# Patient Record
Sex: Female | Born: 2000 | Race: Black or African American | Hispanic: No | Marital: Single
Health system: Southern US, Community
[De-identification: ages and names within clinical notes are randomized; demographics above are authoritative.]

## PROBLEM LIST (undated history)

## (undated) DIAGNOSIS — O139 Gestational [pregnancy-induced] hypertension without significant proteinuria, unspecified trimester: Secondary | ICD-10-CM

## (undated) DIAGNOSIS — K209 Esophagitis, unspecified: Secondary | ICD-10-CM

## (undated) DIAGNOSIS — F4310 Post-traumatic stress disorder, unspecified: Secondary | ICD-10-CM

## (undated) DIAGNOSIS — F32A Depression, unspecified: Secondary | ICD-10-CM

## (undated) DIAGNOSIS — N946 Dysmenorrhea, unspecified: Secondary | ICD-10-CM

## (undated) HISTORY — DX: Dysmenorrhea, unspecified: N94.6

## (undated) HISTORY — DX: Depression, unspecified: F32.A

## (undated) HISTORY — DX: Post-traumatic stress disorder, unspecified: F43.10

## (undated) HISTORY — DX: Esophagitis, unspecified: K20.9

---
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
## 2019-09-24 ENCOUNTER — Encounter (HOSPITAL_COMMUNITY): Payer: Self-pay

## 2019-09-24 ENCOUNTER — Other Ambulatory Visit: Payer: Self-pay

## 2019-09-24 ENCOUNTER — Inpatient Hospital Stay (HOSPITAL_COMMUNITY)
Admission: EM | Admit: 2019-09-24 | Discharge: 2019-09-24 | Disposition: A | Discharge status: Home / Self Care | Payer: Medicaid Other | Attending: Obstetrics and Gynecology | Admitting: Obstetrics and Gynecology

## 2019-09-24 DIAGNOSIS — A599 Trichomoniasis, unspecified: Secondary | ICD-10-CM | POA: Diagnosis not present

## 2019-09-24 DIAGNOSIS — N898 Other specified noninflammatory disorders of vagina: Secondary | ICD-10-CM | POA: Diagnosis present

## 2019-09-24 DIAGNOSIS — O98813 Other maternal infectious and parasitic diseases complicating pregnancy, third trimester: Secondary | ICD-10-CM

## 2019-09-24 DIAGNOSIS — A5901 Trichomonal vulvovaginitis: Secondary | ICD-10-CM | POA: Insufficient documentation

## 2019-09-24 DIAGNOSIS — Z3A30 30 weeks gestation of pregnancy: Secondary | ICD-10-CM | POA: Insufficient documentation

## 2019-09-24 DIAGNOSIS — Z7722 Contact with and (suspected) exposure to environmental tobacco smoke (acute) (chronic): Secondary | ICD-10-CM | POA: Insufficient documentation

## 2019-09-24 DIAGNOSIS — O98313 Other infections with a predominantly sexual mode of transmission complicating pregnancy, third trimester: Secondary | ICD-10-CM | POA: Diagnosis not present

## 2019-09-24 HISTORY — DX: Gestational (pregnancy-induced) hypertension without significant proteinuria, unspecified trimester: O13.9

## 2019-09-24 LAB — WET PREP, GENITAL
Clue Cells Wet Prep HPF POC: NONE SEEN
Sperm: NONE SEEN
Yeast Wet Prep HPF POC: NONE SEEN

## 2019-09-24 MED ORDER — METRONIDAZOLE 500 MG PO TABS
2000.0000 mg | ORAL_TABLET | Freq: Once | ORAL | Status: AC
  Administered 2019-09-24: 10:00:00 2000 mg via ORAL
  Filled 2019-09-24: qty 4

## 2019-09-24 NOTE — MAU Provider Note (Signed)
Patient Katrina Williams is a 18 y.o. G1P0 At [redacted]w[redacted]d here with complaints of on-going vaginal discharge. She denies rupture of membranes, decreased fetal movements, vaginal bleeding or contractions. She was treated for trich on 11/2, but feels that the discharge has continued.   She was recently diagnosed with pre-e at her doctors office and is getting testing at her doctors office weekly. She is not on medication. She denies HA, nausea, vomiting, swelling, blurry vision, floating spots, RUQ pains.  History     CSN: [id number]  Arrival date and time: 09/24/19 1740   None     Chief Complaint  Patient presents with  . Vaginal Discharge   Vaginal Discharge The patient's primary symptoms include vaginal discharge. The problem occurs constantly (worse at night. it fills up her pad. ). The problem has been unchanged. Pertinent negatives include no urgency or vomiting. The vaginal discharge was copious, milky and yellow. There has been no bleeding. She has not been passing clots. She has not been passing tissue. She is not sexually active.    OB History    Gravida  1   Para      Term      Preterm      AB      Living        SAB      TAB      Ectopic      Multiple      Live Births              Past Medical History:  Diagnosis Date  . Dysmenorrhea 03/21/2016  . Esophagitis 03/21/2016  . Pregnancy induced hypertension     current Pre-E disgnosis    History reviewed. No pertinent surgical history.  Family History  Problem Relation Age of Onset  . Obesity Neg Hx   . Heart disease Neg Hx   . Hypertension Neg Hx   . Hypercholesterolemia Neg Hx   . Diabetes Mellitus II Neg Hx     Social History   Tobacco Use  . Smoking status: Passive Smoke Exposure - Never Smoker  . Smokeless tobacco: Never Used  Substance Use Topics  . Alcohol use: No    Comment: given etoh last night  . Drug use: Yes    Types: Marijuana    Comment: some sort of white substance     Allergies: No Known Allergies  Medications Prior to Admission  Medication Sig Dispense Refill Last Dose  . ondansetron (ZOFRAN ODT) 4 MG disintegrating tablet Take 1 tablet (4 mg total) by mouth every 8 (eight) hours as needed for nausea or vomiting. 8 tablet 0   . Prenatal Vit-Fe Fumarate-FA (PRENATAL VITAMIN) 27-0.8 MG TABS Take 1 tablet by mouth daily. 30 tablet 11     Review of Systems  Constitutional: Negative.   HENT: Negative.   Respiratory: Negative.   Cardiovascular: Negative.   Gastrointestinal: Negative.  Negative for vomiting.  Genitourinary: Positive for vaginal discharge. Negative for urgency.  Musculoskeletal: Negative.   Neurological: Negative.   Hematological: Negative.   Psychiatric/Behavioral: Negative.    Physical Exam   Blood pressure 140/74, pulse 75, temperature 98 F (36.7 C), temperature source Oral, resp. rate 16, SpO2 98 %.  Physical Exam  Constitutional: She is oriented to person, place, and time. She appears well-developed and well-nourished.  HENT:  Head: Normocephalic.  Neck: Normal range of motion.  Respiratory: Effort normal.  GI: Soft.  Genitourinary:    Genitourinary Comments: NEFG; copious yellow discharge in the  vagina. Cervix is visually closed; no odor or clumps. No lesions on vaginal walls. No CMT.    Musculoskeletal: Normal range of motion.  Neurological: She is alert and oriented to person, place, and time.  Skin: Skin is warm and dry.  Psychiatric: She has a normal mood and affect.    MAU Course  Procedures  MDM -reviewed patient's records from ob appts; patient was positive for trich on 11/2 and negative for other STIs or infections.  -NST: 135 bpm, mod var, present acel, neg decels, no contractions.  Wet prep: positive for trich again, will re-treat today.  GC pending.   Assessment and Plan   1. Trichomoniasis    2. Patient stable for discharge; tolerated 2 grams Flagyl in the MAU without complaint.   3. List of  providers given; strict return precautions given for pre-e. Plan to keep appt on 11/18 at her doctors' office.   4. Plan of care reviewed with Dr. Ilda Basset, who agrees safe for discharge.    Mervyn Skeeters Kooistra 09/24/2019, 9:41 AM

## 2019-09-24 NOTE — MAU Note (Signed)
Pt is a G1P0 at 30 weeks c/o milky, yellow vaginal discharge, no itching, no smell, no vaginal bleeding, good fetal movement, and no pain.    She was previously treated for tric on 09/15/2019 and completed the medication and had not had sex since.  Pt is not c/o continued discharge and is not able to visit her MD until 11/18.  Pt recently moved to [facility] but is currently getting care in [facility].

## 2019-09-24 NOTE — Discharge Instructions (Signed)
[facility] for [facility] Prenatal Care Providers          **WHEN you call, tell them you are [place] for [place] [facility]   Phone: [phone]    Trichomonas Test Why am I having this test? The trichomonas test is done to diagnose trichomoniasis, a sexually-transmitted infection (STI) caused by an organism called Trichomonas. You may have this test as a part of a routine screening for STIs or if you have symptoms of trichomoniasis. What kind of sample is taken? To perform the test, your health care provider will either ask you to provide a urine sample or take a sample of discharge. The sample will be taken from the vagina or cervix in women and from the urethra in men. How are the results reported? Your test results will be reported as either positive or negative. It is up to you to get your test results. Ask your health care provider, or the department that is doing the test, when your results will be ready. What do the results mean? Negative test result A negative result means that you do not have trichomoniasis. Follow your health care provider's directions about any follow-up testing. Positive test result A positive result means that you have an active infection that needs to be treated with antibiotic medicine. All your current sexual partners must also be treated. If they are not, you will likely get reinfected. If your test result is positive, your health care provider will start you on medicine and may advise you:  Not to have sex until your infection has cleared up.  To use a latex condom properly every time you have sex.  To limit the number of sexual partners you have. The more partners you have, the greater your risk of contracting trichomoniasis or another STI.  To tell all sexual partners about your infection so that they can also get tested and treated. This will prevent reinfection. Talk with your health care provider to discuss your results,  treatment options, and if necessary, the need for more tests. Talk with your health care provider if you have any questions about your results. This information is not intended to replace advice given to you by your health care provider. Make sure you discuss any questions you have with your health care provider. Document Released: 12/02/2004 Document Revised: 10/12/2017 Document Reviewed: 12/26/2016 Elsevier Patient Education  2020 [employer].

## 2019-09-25 LAB — GC/CHLAMYDIA PROBE AMP (~~LOC~~) NOT AT ARMC
Chlamydia: NEGATIVE
Comment: NEGATIVE
Comment: NORMAL
Neisseria Gonorrhea: NEGATIVE

## 2020-06-05 ENCOUNTER — Emergency Department (HOSPITAL_COMMUNITY)
Admission: EM | Admit: 2020-06-05 | Discharge: 2020-06-06 | Disposition: A | Discharge status: Home / Self Care | Payer: Medicaid Other | Attending: Emergency Medicine | Admitting: Emergency Medicine

## 2020-06-05 ENCOUNTER — Emergency Department (HOSPITAL_COMMUNITY): Payer: Medicaid Other

## 2020-06-05 ENCOUNTER — Encounter (HOSPITAL_COMMUNITY): Payer: Self-pay | Admitting: Emergency Medicine

## 2020-06-05 ENCOUNTER — Other Ambulatory Visit: Payer: Self-pay

## 2020-06-05 DIAGNOSIS — M542 Cervicalgia: Secondary | ICD-10-CM | POA: Diagnosis not present

## 2020-06-05 DIAGNOSIS — Y929 Unspecified place or not applicable: Secondary | ICD-10-CM | POA: Insufficient documentation

## 2020-06-05 DIAGNOSIS — R1110 Vomiting, unspecified: Secondary | ICD-10-CM | POA: Diagnosis not present

## 2020-06-05 DIAGNOSIS — S0990XA Unspecified injury of head, initial encounter: Secondary | ICD-10-CM | POA: Diagnosis not present

## 2020-06-05 DIAGNOSIS — Y999 Unspecified external cause status: Secondary | ICD-10-CM | POA: Diagnosis not present

## 2020-06-05 DIAGNOSIS — Y939 Activity, unspecified: Secondary | ICD-10-CM | POA: Insufficient documentation

## 2020-06-05 DIAGNOSIS — Z5321 Procedure and treatment not carried out due to patient leaving prior to being seen by health care provider: Secondary | ICD-10-CM | POA: Diagnosis not present

## 2020-06-05 LAB — I-STAT BETA HCG BLOOD, ED (MC, WL, AP ONLY): I-stat hCG, quantitative: >2000 m[IU]/mL — ABNORMAL HIGH (ref <5)
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
## 2020-06-06 NOTE — ED Notes (Signed)
Pt left. 

## 2020-12-28 ENCOUNTER — Inpatient Hospital Stay (HOSPITAL_COMMUNITY)
Admission: EM | Admit: 2020-12-28 | Discharge: 2020-12-28 | Disposition: A | Discharge status: Home / Self Care | Payer: Medicaid Other | Attending: Obstetrics and Gynecology | Admitting: Obstetrics and Gynecology

## 2020-12-28 DIAGNOSIS — Z3401 Encounter for supervision of normal first pregnancy, first trimester: Secondary | ICD-10-CM | POA: Diagnosis not present

## 2020-12-28 DIAGNOSIS — Z3A00 Weeks of gestation of pregnancy not specified: Secondary | ICD-10-CM

## 2020-12-28 DIAGNOSIS — Z3201 Encounter for pregnancy test, result positive: Secondary | ICD-10-CM | POA: Insufficient documentation

## 2020-12-28 DIAGNOSIS — Z3490 Encounter for supervision of normal pregnancy, unspecified, unspecified trimester: Secondary | ICD-10-CM

## 2020-12-28 LAB — I-STAT BETA HCG BLOOD, ED (MC, WL, AP ONLY): I-stat hCG, quantitative: >2000 m[IU]/mL — ABNORMAL HIGH (ref <5)

## 2020-12-28 NOTE — MAU Note (Signed)
Patient asked if she can have ultrasound if she signs out and signs back in with pregnancy complaints.  Patient informed I can't tell her she can't sign in but that we are an acute care setting that utilizes our resources to treat patients with medical emergencies.  Then reassured the patient that the provider would be in to discuss options with her as to where she could be seen for her verification.

## 2020-12-28 NOTE — ED Triage Notes (Signed)
Patient here for pregnancy test. Sent by family planning. Patient has had positive home pregnancy tests.

## 2020-12-28 NOTE — ED Notes (Signed)
Pt is pregnant and needs an ultrasound to confirm how many weeks she is for further treatment at a womans choice clinic.  Pt does not know how many weeks she is but states that her last menstrual period was 10/21

## 2020-12-28 NOTE — MAU Note (Signed)
Patient wants U/S to confirm dates for a woman's choice for termination of pregnancy.  Denies any complaints.

## 2020-12-28 NOTE — MAU Provider Note (Signed)
Event Date/Time   First Provider Initiated Contact with Patient 12/28/20 1321      S Ms. Yissel Habermehl is a 20 y.o. G1P0 patient who presents to MAU today with complaint of none. Patient reports she is here for an ultrasound so she can get a termination. Patient reports she called a termination office to schedule and was told that she needs to get an [place] to verify how far along she is. Patient reports LMP 08/2020. Patient upset that she cannot get an [place] through MAU and reports she is considering checking out and checking back in with a new concern that her baby is not growing so that she can get an ultrasound. Patient denies any bleeding or pain.  O BP 140/77    Pulse (!) 102    Temp 98 F (36.7 C)    Resp 17    SpO2 100%    Patient Vitals for the past 24 hrs:  BP Temp Pulse Resp SpO2  12/28/20 1312 140/77 98 F (36.7 C) (!) 102 17 --  12/28/20 1141 (!) 148/83 98.5 F (36.9 C) (!) 101 14 100 %    Physical Exam Vitals and nursing note reviewed.  Constitutional:      Appearance: Normal appearance.  HENT:     Head: Normocephalic and atraumatic.  Pulmonary:     Effort: Pulmonary effort is normal.  Abdominal:     Comments: Fundal height palpated just above umbilicus, but pt states she has a retroverted uterus.  Neurological:     Mental Status: She is alert and oriented to person, place, and time.  Psychiatric:        Mood and Affect: Mood normal.        Behavior: Behavior normal.        Thought Content: Thought content normal.        Judgment: Judgment normal.    A Medical screening exam complete Pregnancy with uncertain dates  P Discharge from MAU in stable condition Outpatient [place] ordered List of options for follow-up given Warning signs for worsening condition that would warrant emergency follow-up discussed Patient may return to MAU as needed   Armari Fussell, Odie Sera, NP 12/28/2020 1:41 PM

## 2020-12-28 NOTE — ED Triage Notes (Signed)
Emergency Medicine Provider OB Triage Evaluation Note  Katrina Williams is a 20 y.o. female, G1P0, at Unknown gestation who presents to the emergency department for pregnancy test.  Patient reports her last menstrual cycle was sometime in October she does not have an exact date.  She took a home pregnancy test this week and it was positive.  She attempted to go to a family-planning center for pregnancy termination but was informed she needed to come to the ER instead for an accurate gestational age prior to the procedure.  Review of  Systems  Positive: None Negative: Fever chills abdominal pain nausea vomiting diarrhea dysuria hematuria vaginal bleeding vaginal discharge or any additional concerns.  Physical Exam  BP (!) 148/83 (BP Location: Left Arm)   Pulse (!) 101   Temp 98.5 F (36.9 C)   Resp 14   SpO2 100%  General: Awake, no distress  HEENT: Atraumatic  Resp: Normal effort  Cardiac: Normal rate Abd: Nondistended, nontender  MSK: Moves all extremities without difficulty Neuro: Speech clear  Medical Decision Making  Pt evaluated for pregnancy concern, i-STAT beta-hCG positive in the ER.  The patient is stable for transfer to MAU. Pt is in agreement with plan for transfer.  12:40 PM Discussed with MAU APP, Sam, who accepts patient in transfer.    Clinical Impression   1. Positive pregnancy test        Bill Salinas, PA-C 12/28/20 1240

## 2020-12-28 NOTE — Discharge Instructions (Signed)
Prenatal Care Providers           [facility] - accepts patients without insurance  Phone: [phone]  [facility] @ [facility]   Phone: [phone]  [facility]       Phone: [phone]            [facility] @ [facility]     Phone: [phone]          [facility] @ [place]   Phone: [phone]  [facility] @ [facility] - accepts patients without insurance  Phone: [phone]  [facility] @ [facility] Phone: [phone]     [facility] - accepts patients without insurance Phone: [phone]  [facility]  Phone: [phone]  [facility] Phone: [phone]  [facility] Phone: [phone]  [facility] Phone: [phone]  [facility] Phone: [phone]  [facility]  Phone: [phone]         

## 2020-12-30 ENCOUNTER — Other Ambulatory Visit: Payer: Self-pay

## 2020-12-30 ENCOUNTER — Other Ambulatory Visit: Payer: Self-pay | Admitting: Women's Health

## 2020-12-30 ENCOUNTER — Telehealth: Payer: Self-pay | Admitting: Medical

## 2020-12-30 ENCOUNTER — Ambulatory Visit (HOSPITAL_BASED_OUTPATIENT_CLINIC_OR_DEPARTMENT_OTHER): Payer: Medicaid Other

## 2020-12-30 ENCOUNTER — Ambulatory Visit
Admission: RE | Admit: 2020-12-30 | Discharge: 2020-12-30 | Disposition: A | Discharge status: Home / Self Care | Payer: Medicaid Other | Source: Ambulatory Visit | Attending: Women's Health | Admitting: Women's Health

## 2020-12-30 DIAGNOSIS — Z3490 Encounter for supervision of normal pregnancy, unspecified, unspecified trimester: Secondary | ICD-10-CM | POA: Diagnosis not present

## 2020-12-30 NOTE — Progress Notes (Signed)
[place] order.  Marylen Ponto, NP  10:21 AM 12/30/2020

## 2020-12-30 NOTE — Telephone Encounter (Signed)
I called Shelly Bombard today at 3:41 PM and confirmed patient's identity using two patient identifiers. [place] results from earlier today were reviewed. Patient voiced understanding and had no further questions.   [place] MFM OB LIMITED  Result Date: 12/30/2020 ----------------------------------------------------------------------  OBSTETRICS REPORT                       (Signed Final 12/30/2020 03:34 pm) ---------------------------------------------------------------------- Patient Info  ID #:       [id number]                          D.O.B.:  03/18/2001 (19 yrs)  Name:       Katrina Williams                   Visit Date: 12/30/2020 01:55 pm ---------------------------------------------------------------------- Performed By  Attending:        Noralee Space MD        Referred By:      Marylen Ponto  Performed By:     Magnus Ivan           [facility] ---------------------------------------------------------------------- Orders  #  Description                           Code        Ordered By  1  [place] MFM OB LIMITED                     76815.01    NICOLE NUGENT ----------------------------------------------------------------------  #  Order #                     Accession #                Episode #  1  [id number]                   [id number]                 [id number] ---------------------------------------------------------------------- Indications  [redacted] weeks gestation of pregnancy                Z3A.18  Encounter for uncertain dates                  Z36.87 ---------------------------------------------------------------------- Fetal Evaluation  Num Of  Fetuses:         1  Fetal Heart Rate(bpm):  147  Cardiac Activity:       Observed  Placenta:               Anterior  Amniotic Fluid  AFI FV:       Subjectively normal ---------------------------------------------------------------------- Biometry  BPD:      42.1  mm     G. Age:  18w 5d         81  %    CI:        78.59   %    70 - 86                                                          FL/HC:      15.8   %    15.8 - 18  HC:      150.2  mm     G. Age:  18w 1d         46  %    HC/AC:      1.21        1.07 - 1.29  AC:       124   mm     G. Age:  18w 0d         49  %    FL/BPD:     56.3   %  FL:       23.7  mm     G. Age:  17w 1d         16  %    FL/AC:      19.1   %    20 - 24  Est. FW:     205  gm      0 lb 7 oz     26  % ---------------------------------------------------------------------- OB History  Gravidity:    2         Term:   1  Living:       1 ---------------------------------------------------------------------- Gestational Age  U/S Today:     18w 0d                                        EDD:   06/02/21  Best:          18w 0d     Det. By:  U/S (12/30/20)           EDD:   06/02/21 ---------------------------------------------------------------------- Cervix Uterus Adnexa  Right Ovary  Size(cm)       2.7  x   1.6    x  1.6       Vol(ml): 3.62  Within normal limits.  Left Ovary  Size(cm)        3   x   2.1    x  2.3       Vol(ml): 7.59  Small corpus luteum noted. ---------------------------------------------------------------------- Impression  G1 P0. Patient is here for pregnancy-dating scan.  A limited ultrasound study was performed. Fetal biometry is  consistent with 18-weeks' gestation. Amniotic fluid is normal.  Detailed fetal anatomical survey was not performed.  This study was remotely read.  I will communicate with  Vonzella Nipple, NP through [facility] ---------------------------------------------------------------------- Recommendations  New Ob appointment and patient to be referred for detailed  fetal anatomy scan in 2 weeks. ----------------------------------------------------------------------                  Noralee Space, MD Electronically  Signed Final Report   12/30/2020 03:34 pm ----------------------------------------------------------------------   Vonzella Nipple, PA-C 12/30/2020 3:41 PM

## 2021-01-05 ENCOUNTER — Other Ambulatory Visit: Payer: Medicaid Other

## 2021-02-06 ENCOUNTER — Emergency Department (HOSPITAL_COMMUNITY)
Admission: EM | Admit: 2021-02-06 | Discharge: 2021-02-06 | Disposition: A | Discharge status: Home / Self Care | Payer: Medicaid Other | Attending: Emergency Medicine | Admitting: Emergency Medicine

## 2021-02-06 ENCOUNTER — Emergency Department (HOSPITAL_COMMUNITY): Payer: Medicaid Other

## 2021-02-06 ENCOUNTER — Encounter (HOSPITAL_COMMUNITY): Payer: Self-pay | Admitting: Emergency Medicine

## 2021-02-06 ENCOUNTER — Other Ambulatory Visit: Payer: Self-pay

## 2021-02-06 DIAGNOSIS — Y92410 Unspecified street and highway as the place of occurrence of the external cause: Secondary | ICD-10-CM | POA: Insufficient documentation

## 2021-02-06 DIAGNOSIS — R519 Headache, unspecified: Secondary | ICD-10-CM | POA: Diagnosis not present

## 2021-02-06 DIAGNOSIS — S8991XA Unspecified injury of right lower leg, initial encounter: Secondary | ICD-10-CM | POA: Diagnosis present

## 2021-02-06 DIAGNOSIS — Z7722 Contact with and (suspected) exposure to environmental tobacco smoke (acute) (chronic): Secondary | ICD-10-CM | POA: Insufficient documentation

## 2021-02-06 DIAGNOSIS — S86911A Strain of unspecified muscle(s) and tendon(s) at lower leg level, right leg, initial encounter: Secondary | ICD-10-CM | POA: Insufficient documentation

## 2021-02-06 DIAGNOSIS — S00531A Contusion of lip, initial encounter: Secondary | ICD-10-CM | POA: Diagnosis not present

## 2021-02-06 MED ORDER — ACETAMINOPHEN 500 MG PO TABS
1000.0000 mg | ORAL_TABLET | Freq: Once | ORAL | Status: AC
  Administered 2021-02-06: 1000 mg via ORAL
  Filled 2021-02-06: qty 2

## 2021-02-06 MED ORDER — IBUPROFEN 200 MG PO TABS
600.0000 mg | ORAL_TABLET | Freq: Once | ORAL | Status: AC
  Administered 2021-02-06: 600 mg via ORAL
  Filled 2021-02-06: qty 3

## 2021-02-06 NOTE — ED Notes (Signed)
Patient transported to CT 
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
## 2021-02-06 NOTE — ED Notes (Addendum)
Patient ambulatory with limp in treatment room. Reports pain with ambulation. MD made aware.
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
## 2021-02-06 NOTE — Discharge Instructions (Addendum)
If you develop headache, neck pain, chest pain, trouble breathing, abdominal pain, vomiting, new or worsening pain in your leg, are unable to walk/bear weight or do not improve over the next 24-48 hours then return to the ER or see your doctor

## 2021-03-21 ENCOUNTER — Other Ambulatory Visit: Payer: Medicaid Other
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
## 2024-05-22 ENCOUNTER — Ambulatory Visit (HOSPITAL_COMMUNITY)
Admission: EM | Admit: 2024-05-22 | Discharge: 2024-05-22 | Disposition: A | Discharge status: Home / Self Care | Attending: Psychiatry | Admitting: Psychiatry

## 2024-05-22 DIAGNOSIS — Z560 Unemployment, unspecified: Secondary | ICD-10-CM | POA: Insufficient documentation

## 2024-05-22 DIAGNOSIS — Z91199 Patient's noncompliance with other medical treatment and regimen due to unspecified reason: Secondary | ICD-10-CM | POA: Insufficient documentation

## 2024-05-22 DIAGNOSIS — Z9151 Personal history of suicidal behavior: Secondary | ICD-10-CM | POA: Insufficient documentation

## 2024-05-22 DIAGNOSIS — F39 Unspecified mood [affective] disorder: Secondary | ICD-10-CM | POA: Insufficient documentation

## 2024-05-22 NOTE — Discharge Instructions (Addendum)
 Discharge recommendations:   Outpatient Follow up:   You are encouraged to follow up with [facility] for outpatient treatment.  You are scheduled for counseling on July 29, 2024 at 1: 00 PM  You are scheduled for psychiatry on July 16, 2024 at 1:00 PM  Walk in/ Open Access Hours: Monday - Friday 8AM - 11AM (Please arrive at 7 AM)  [facility] [address] [place], [place] [phone]  Therapy: We recommend that patient participate in individual therapy to address mental health concerns.  Safety:   The following safety precautions should be taken:   No sharp objects. This includes scissors, razors, scrapers, and putty knives.   Chemicals should be removed and locked up.   Medications should be removed and locked up.   Weapons should be removed and locked up. This includes firearms, knives and instruments that can be used to cause injury.   The patient should abstain from use of illicit substances/drugs and abuse of any medications.  If symptoms worsen or do not continue to improve or if the patient becomes actively suicidal or homicidal then it is recommended that the patient return to the closest hospital emergency department, the [facility], or call 911 for further evaluation and treatment. National Suicide Prevention Lifeline 1-800-SUICIDE or 737-452-1436.  About 988 988 offers 24/7 access to trained crisis counselors who can help people experiencing mental health-related distress. People can call or text 988 or chat 988lifeline.org for themselves or if they are worried about a loved one who may need crisis support.   Mindfulness-Based Stress Reduction Mindfulness-based stress reduction (MBSR) is a program that helps people learn to practice mindfulness. Mindfulness is the practice of consciously paying attention to the present moment. MBSR focuses on developing self-awareness, which lets  you respond to life stress without judgment or negative feelings. It can be learned and practiced through techniques such as education, breathing exercises, meditation, and yoga. MBSR includes several mindfulness techniques in one program. MBSR works best when you understand the treatment, are willing to try new things, and can commit to spending time practicing what you learn. MBSR training may include learning about: How your feelings, thoughts, and reactions affect your body. New ways to respond to things that cause negative thoughts to start (triggers). How to notice your thoughts and let go of them. Practicing awareness of everyday things that you normally do without thinking. The techniques and goals of different types of meditation. What are the benefits of MBSR? MBSR can have many benefits, which include helping you to: Develop self-awareness. This means knowing and understanding yourself. Learn skills and attitudes that help you to take part in your own health care. Learn new ways to care for yourself. Be more accepting about how things are, and let things go. Be less judgmental and approach things with an open mind. Be patient with yourself and trust yourself more. MBSR has also been shown to: Reduce negative emotions, such as sadness, overwhelm, and worry. Improve memory and focus. Change how you sense and react to pain. Boost your body's ability to fight infections. Help you connect better with other people. Improve your sense of well-being. How to practice mindfulness To do a basic awareness exercise: Find a comfortable place to sit. Pay attention to the present moment. Notice your thoughts, feelings, and surroundings just as they are. Avoid judging yourself, your feelings, or your surroundings. Make note of any judgment that comes up and let it go. Your  mind may wander, and that is okay. Make note of when your thoughts drift, and return your attention to the present  moment. To do basic mindfulness meditation: Find a comfortable place to sit. This may include a stable chair or a firm floor cushion. Sit upright with your back straight. Let your arms fall next to your sides, with your hands resting on your legs. If you are sitting in a chair, rest your feet flat on the floor. If you are sitting on a cushion, cross your legs in front of you. Keep your head in a neutral position with your chin dropped slightly. Relax your jaw and rest the tip of your tongue on the roof of your mouth. Drop your gaze to the floor or close your eyes. Breathe normally and pay attention to your breath. Feel the air moving in and out of your nose. Feel your belly expanding and relaxing with each breath. Your mind may wander, and that is okay. Make note of when your thoughts drift, and return your attention to your breath. Avoid judging yourself, your feelings, or your surroundings. Make note of any judgment or feelings that come up, let them go, and bring your attention back to your breath. When you are ready, lift your gaze or open your eyes. Pay attention to how your body feels after the meditation. Follow these instructions at home:  Find a local in-person or online MBSR program. Set aside some time regularly for mindfulness practice. Practice every day if you can. Even 10 minutes of practice is helpful. Find a mindfulness practice that works best for you. This may include one or more of the following: Meditation. This involves focusing your mind on a certain thought or activity. Breathing awareness exercises. These help you to stay present by focusing on your breath. Body scan. For this practice, you lie down and pay attention to each part of your body from head to toe. You can identify tension and soreness and consciously relax parts of your body. Yoga. Yoga involves stretching and breathing, and it can improve your ability to move and be flexible. It can also help you to test your  body's limits, which can help you release stress. Mindful eating. This way of eating involves focusing on the taste, texture, color, and smell of each bite of food. This slows down eating and helps you feel full sooner. For this reason, it can be an important part of a weight loss plan. Find a podcast or recording that provides guidance for breathing awareness, body scan, or meditation exercises. You can listen to these any time when you have a free moment to rest without distractions. Follow your treatment plan as told by your health care provider. This may include taking regular medicines and making changes to your diet or lifestyle as recommended. Where to find more information You can find more information about MBSR from: Your health care provider. Community-based meditation centers or programs. Programs offered near you. Summary Mindfulness-based stress reduction (MBSR) is a program that teaches you how to consciously pay attention to the present moment. It is used to help you deal better with daily stress, feelings, and pain. MBSR focuses on developing self-awareness, which allows you to respond to life stress without judgment or negative feelings. MBSR programs may involve learning different mindfulness practices, such as breathing exercises, meditation, yoga, body scan, or mindful eating. Find a mindfulness practice that works best for you, and set aside time for it on a regular basis. This information  is not intended to replace advice given to you by your health care provider. Make sure you discuss any questions you have with your health care provider.
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
## 2024-05-22 NOTE — ED Notes (Signed)
 Discharged by provider
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
## 2024-05-23 ENCOUNTER — Encounter (HOSPITAL_COMMUNITY): Payer: Self-pay | Admitting: Emergency Medicine

## 2024-05-23 ENCOUNTER — Other Ambulatory Visit: Payer: Self-pay

## 2024-05-23 ENCOUNTER — Emergency Department (HOSPITAL_COMMUNITY)
Admission: EM | Admit: 2024-05-23 | Discharge: 2024-05-23 | Disposition: A | Discharge status: Home / Self Care | Attending: Emergency Medicine | Admitting: Emergency Medicine

## 2024-05-23 DIAGNOSIS — K644 Residual hemorrhoidal skin tags: Secondary | ICD-10-CM | POA: Insufficient documentation

## 2024-05-23 DIAGNOSIS — K6289 Other specified diseases of anus and rectum: Secondary | ICD-10-CM | POA: Diagnosis present

## 2024-05-23 MED ORDER — LIDOCAINE HCL URETHRAL/MUCOSAL 2 % EX GEL
1.0000 | Freq: Once | CUTANEOUS | Status: AC
  Administered 2024-05-23: 1
  Filled 2024-05-23: qty 11

## 2024-05-23 MED ORDER — SULFAMETHOXAZOLE-TRIMETHOPRIM 800-160 MG PO TABS: 1.0000 | ORAL_TABLET | Freq: Two times a day (BID) | ORAL | 0 refills | Status: AC

## 2024-05-23 MED ORDER — HYDROCORTISONE 2.5 % EX CREA: TOPICAL_CREAM | Freq: Two times a day (BID) | CUTANEOUS | 0 refills | Status: AC

## 2024-05-23 NOTE — Discharge Instructions (Addendum)
 You should take warm soaks lasting 10-15 minutes in your tub at least once daily for your bottom.  Use the lidocaine  numbing jelly as needed before bowel movements.  Apply the hydrocortisone  steroid cream twice per day for 10 days.  You should also take the antibiotic prescribed.  If you have persistent symptoms after 1 week, you can call the number above to make an appointment at the Rectal surgical clinic.

## 2024-05-23 NOTE — ED Provider Notes (Signed)
 [place] EMERGENCY DEPARTMENT AT [facility] Provider Note   CSN: [id number] Arrival date & time: 05/23/24  2027     Patient presents with: Hemorrhoids   Katrina Williams is a 23 y.o. female here with complaint of rectal lump and pain for 2 days, painful.  No discharge from anus.  No fevers or chills.  Painful to walk.  Not too bad pain to have bowel movement   HPI     Prior to Admission medications   Medication Sig Start Date End Date Taking? Authorizing Provider  hydrocortisone  2.5 % cream Apply topically 2 (two) times daily for 10 days. Apply to rectum twice daily for 10 days 05/23/24 06/02/24 Yes Yarah Fuente, Donnice PARAS, MD  sulfamethoxazole -trimethoprim  (BACTRIM  DS) 800-160 MG tablet Take 1 tablet by mouth 2 (two) times daily for 7 days. 05/23/24 05/30/24 Yes Dolph Tavano, Donnice PARAS, MD  ondansetron  (ZOFRAN  ODT) 4 MG disintegrating tablet Take 1 tablet (4 mg total) by mouth every 8 (eight) hours as needed for nausea or vomiting. 12/11/18   Rice, Lauraine Deem, MD  Prenatal Vit-Fe Fumarate-FA (PRENATAL VITAMIN) 27-0.8 MG TABS Take 1 tablet by mouth daily. 11/21/18   Viviana Aleck DASEN, FNP    Allergies: Patient has no known allergies.    Review of Systems  Updated Vital Signs BP (!) 147/108   Pulse 89   Temp 99.3 F (37.4 C) (Oral)   Resp 17   SpO2 100%   Physical Exam Constitutional:      General: She is not in acute distress. HENT:     Head: Normocephalic and atraumatic.  Eyes:     Conjunctiva/sclera: Conjunctivae normal.     Pupils: Pupils are equal, round, and reactive to light.  Cardiovascular:     Rate and Rhythm: Normal rate and regular rhythm.  Pulmonary:     Effort: Pulmonary effort is normal. No respiratory distress.  Genitourinary:    Comments: Rectal exam was performed with chaperone Sherryle Ryder RN present Patient is small region of induration at the 3 o'clock position outside external sphincter, with small nonthrombosed hemorrhoid external to  anus Skin:    General: Skin is warm and dry.  Neurological:     General: No focal deficit present.     Mental Status: She is alert. Mental status is at baseline.  Psychiatric:        Mood and Affect: Mood normal.        Behavior: Behavior normal.     (all labs ordered are listed, but only abnormal results are displayed) Labs Reviewed - No data to display  EKG: None  Radiology: No results found.   Procedures   Medications Ordered in the ED  lidocaine  (XYLOCAINE ) 2 % jelly 1 Application (has no administration in time range)                                    Medical Decision Making Risk Prescription drug management.   Suspect this may be a symptomatic internal hemorrhoid, but there is also possibility of a small abscess as there is a region of fluctuance near the sphincter muscle.  Does not appear to be tracking deeper.  She is afebrile.  I do not think he is needing a CT scan.  But I think it is reasonable to treat with antibiotics, in addition to Anusol  type medication for potential hemorrhoid.  If she is not improving 1 week and follow-up with  the colorectal clinic     Final diagnoses:  Anal pain    ED Discharge Orders          Ordered    sulfamethoxazole -trimethoprim  (BACTRIM  DS) 800-160 MG tablet  2 times daily        05/23/24 2148    hydrocortisone  2.5 % cream  2 times daily        05/23/24 2148               Cottie Donnice PARAS, MD 05/23/24 2152

## 2024-05-23 NOTE — ED Triage Notes (Signed)
 Pt presents stating she has a lump in her rectal area that has been there for 2 days. Painful.  Tried OTC creams without relief.
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
## 2024-07-16 ENCOUNTER — Encounter (HOSPITAL_COMMUNITY): Payer: Self-pay | Admitting: Psychiatry

## 2024-07-16 ENCOUNTER — Ambulatory Visit (INDEPENDENT_AMBULATORY_CARE_PROVIDER_SITE_OTHER): Admitting: Psychiatry

## 2024-07-16 DIAGNOSIS — F1321 Sedative, hypnotic or anxiolytic dependence, in remission: Secondary | ICD-10-CM | POA: Diagnosis not present

## 2024-07-16 DIAGNOSIS — F331 Major depressive disorder, recurrent, moderate: Secondary | ICD-10-CM | POA: Diagnosis not present

## 2024-07-16 DIAGNOSIS — F4310 Post-traumatic stress disorder, unspecified: Secondary | ICD-10-CM | POA: Diagnosis not present

## 2024-07-16 DIAGNOSIS — F1020 Alcohol dependence, uncomplicated: Secondary | ICD-10-CM | POA: Insufficient documentation

## 2024-07-16 MED ORDER — SERTRALINE HCL 50 MG PO TABS
ORAL_TABLET | ORAL | 1 refills | Status: DC
Start: 2024-07-16 — End: 2024-09-10

## 2024-07-16 NOTE — Patient Instructions (Addendum)
 Thank you for attending your appointment today.  -- START Zoloft  25 mg daily for 1 week then INCREASE to 50 mg daily -- Continue other medications as prescribed. -- Reduce and ultimately discontinue use of alcohol and other substances  Please do not make any changes to medications without first discussing with your provider. If you are experiencing a psychiatric emergency, please call 911 or present to your nearest emergency department. Additional crisis, medication management, and therapy resources are included below.  [facility]  [address] [place], [place] [address] [phone] WALK-IN URGENT CARE 24/7 FOR ANYONE [address], [place], [place]  [phone] Fax: [phone] [email or web address] *Interpreters available *Accepts all insurance and uninsured for Urgent Care needs *Accepts Medicaid and uninsured for outpatient treatment (below)      ONLY FOR [facility]  Below:    Outpatient New Patient Assessment/Therapy Walk-ins:        Monday, Wednesday, and Thursday 8am until slots are full (first come, first served)                   New Patient Psychiatry/Medication Management        Monday-Friday 8am-11am (first come, first served)               For all walk-ins we ask that you arrive by 7:15am, because patients will be seen in the order of arrival.

## 2024-07-23 ENCOUNTER — Other Ambulatory Visit (HOSPITAL_COMMUNITY)

## 2024-07-29 ENCOUNTER — Ambulatory Visit (HOSPITAL_COMMUNITY): Admitting: Clinical

## 2024-08-07 ENCOUNTER — Ambulatory Visit (HOSPITAL_COMMUNITY)
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
## 2024-09-10 ENCOUNTER — Telehealth (INDEPENDENT_AMBULATORY_CARE_PROVIDER_SITE_OTHER): Payer: MEDICAID | Admitting: Psychiatry

## 2024-09-10 ENCOUNTER — Encounter (HOSPITAL_COMMUNITY): Payer: Self-pay | Admitting: Psychiatry

## 2024-09-10 DIAGNOSIS — F1321 Sedative, hypnotic or anxiolytic dependence, in remission: Secondary | ICD-10-CM

## 2024-09-10 DIAGNOSIS — F331 Major depressive disorder, recurrent, moderate: Secondary | ICD-10-CM | POA: Diagnosis not present

## 2024-09-10 DIAGNOSIS — F4310 Post-traumatic stress disorder, unspecified: Secondary | ICD-10-CM

## 2024-09-10 DIAGNOSIS — F1020 Alcohol dependence, uncomplicated: Secondary | ICD-10-CM | POA: Diagnosis not present

## 2024-09-10 MED ORDER — SERTRALINE HCL 50 MG PO TABS: ORAL_TABLET | ORAL | 1 refills | Status: AC

## 2024-09-10 NOTE — Patient Instructions (Signed)
 Thank you for attending your appointment today.  -- START Zoloft 25 mg daily for 1 week then INCREASE to 50 mg daily -- Continue other medications as prescribed.  Please do not make any changes to medications without first discussing with your provider. If you are experiencing a psychiatric emergency, please call 911 or present to your nearest emergency department. Additional crisis, medication management, and therapy resources are included below.  [facility]  [address], [place], [place] [address] [phone] WALK-IN URGENT CARE 24/7 FOR ANYONE [address], [place], [place]  [phone] Fax: [phone] [email or web address] *Interpreters available *Accepts all insurance and uninsured for Urgent Care needs *Accepts Medicaid and uninsured for outpatient treatment (below)      ONLY FOR [facility]  Below:    Outpatient New Patient Assessment/Therapy Walk-ins:        Monday, Wednesday, and Thursday 8am until slots are full (first come, first served)                   New Patient Psychiatry/Medication Management        Monday-Friday 8am-11am (first come, first served)               For all walk-ins we ask that you arrive by 7:15am, because patients will be seen in the order of arrival.

## 2024-09-17 ENCOUNTER — Other Ambulatory Visit (HOSPITAL_COMMUNITY): Payer: MEDICAID

## 2024-10-13 NOTE — Progress Notes (Unsigned)
 Patient did not show up for the appointment.  Attempted to call the patient  but received no response and unable to leave a voicemail.  Ismael Franco, MD PGY-3 Psychiatry Resident

## 2024-10-16 ENCOUNTER — Ambulatory Visit (HOSPITAL_COMMUNITY): Payer: MEDICAID

## 2024-10-23 ENCOUNTER — Encounter (HOSPITAL_COMMUNITY): Payer: MEDICAID | Admitting: Psychiatry

## 2024-10-23 DIAGNOSIS — F331 Major depressive disorder, recurrent, moderate: Secondary | ICD-10-CM

## 2025-01-06 ENCOUNTER — Ambulatory Visit: Payer: Self-pay | Admitting: Nurse Practitioner

## 2025-02-12 ENCOUNTER — Encounter: Payer: Self-pay | Admitting: Obstetrics and Gynecology
